# Patient Record
Sex: Female | Born: 1935 | Race: White | Hispanic: No
Health system: Southern US, Community
[De-identification: ages and names within clinical notes are randomized; demographics above are authoritative.]

---

## 2004-06-28 ENCOUNTER — Other Ambulatory Visit: Payer: Self-pay

## 2004-08-11 ENCOUNTER — Ambulatory Visit: Payer: Self-pay | Admitting: Internal Medicine

## 2004-08-25 ENCOUNTER — Ambulatory Visit: Payer: Self-pay | Admitting: Internal Medicine

## 2004-08-26 ENCOUNTER — Ambulatory Visit: Payer: Self-pay | Admitting: Oncology

## 2004-09-18 ENCOUNTER — Ambulatory Visit: Payer: Self-pay | Admitting: Oncology

## 2004-09-21 ENCOUNTER — Ambulatory Visit: Payer: Self-pay | Admitting: Oncology

## 2004-10-01 ENCOUNTER — Ambulatory Visit: Payer: Self-pay | Admitting: Oncology

## 2004-11-19 ENCOUNTER — Ambulatory Visit: Payer: Self-pay | Admitting: Oncology

## 2004-12-02 ENCOUNTER — Ambulatory Visit: Payer: Self-pay | Admitting: Oncology

## 2005-02-08 ENCOUNTER — Ambulatory Visit: Payer: Self-pay | Admitting: Oncology

## 2005-02-10 ENCOUNTER — Ambulatory Visit: Payer: Self-pay | Admitting: Oncology

## 2005-03-01 ENCOUNTER — Ambulatory Visit: Payer: Self-pay | Admitting: Oncology

## 2005-03-16 ENCOUNTER — Ambulatory Visit: Payer: Self-pay | Admitting: Gastroenterology

## 2005-03-26 ENCOUNTER — Other Ambulatory Visit: Payer: Self-pay

## 2005-04-02 ENCOUNTER — Inpatient Hospital Stay: Payer: Self-pay | Admitting: General Surgery

## 2005-04-02 ENCOUNTER — Ambulatory Visit: Payer: Self-pay | Admitting: Internal Medicine

## 2005-05-14 ENCOUNTER — Ambulatory Visit: Payer: Self-pay | Admitting: Oncology

## 2005-05-18 ENCOUNTER — Ambulatory Visit: Payer: Self-pay | Admitting: Oncology

## 2005-08-18 ENCOUNTER — Ambulatory Visit: Payer: Self-pay | Admitting: Oncology

## 2005-08-24 ENCOUNTER — Ambulatory Visit: Payer: Self-pay | Admitting: Oncology

## 2005-09-20 ENCOUNTER — Ambulatory Visit: Payer: Self-pay | Admitting: Oncology

## 2005-09-21 ENCOUNTER — Ambulatory Visit: Payer: Self-pay | Admitting: Oncology

## 2005-10-01 ENCOUNTER — Ambulatory Visit: Payer: Self-pay | Admitting: Oncology

## 2005-11-26 ENCOUNTER — Ambulatory Visit: Payer: Self-pay | Admitting: Oncology

## 2005-12-06 ENCOUNTER — Emergency Department: Payer: Self-pay | Admitting: Internal Medicine

## 2005-12-09 ENCOUNTER — Ambulatory Visit: Payer: Self-pay | Admitting: Oncology

## 2006-01-13 ENCOUNTER — Ambulatory Visit: Payer: Self-pay | Admitting: Oncology

## 2006-01-21 ENCOUNTER — Ambulatory Visit: Payer: Self-pay | Admitting: Otolaryngology

## 2006-01-26 ENCOUNTER — Ambulatory Visit: Payer: Self-pay | Admitting: Otolaryngology

## 2006-01-30 ENCOUNTER — Ambulatory Visit: Payer: Self-pay | Admitting: Oncology

## 2006-03-18 ENCOUNTER — Ambulatory Visit: Payer: Self-pay | Admitting: Oncology

## 2006-04-01 ENCOUNTER — Ambulatory Visit: Payer: Self-pay | Admitting: Oncology

## 2006-04-17 ENCOUNTER — Other Ambulatory Visit: Payer: Self-pay

## 2006-04-18 ENCOUNTER — Inpatient Hospital Stay: Payer: Self-pay | Admitting: Internal Medicine

## 2006-04-25 ENCOUNTER — Ambulatory Visit: Payer: Self-pay | Admitting: Internal Medicine

## 2006-05-02 ENCOUNTER — Ambulatory Visit: Payer: Self-pay | Admitting: Internal Medicine

## 2006-05-06 ENCOUNTER — Emergency Department: Payer: Self-pay | Admitting: Emergency Medicine

## 2006-05-06 ENCOUNTER — Other Ambulatory Visit: Payer: Self-pay

## 2006-05-16 ENCOUNTER — Ambulatory Visit: Payer: Self-pay | Admitting: Internal Medicine

## 2006-05-19 ENCOUNTER — Ambulatory Visit: Payer: Self-pay | Admitting: Internal Medicine

## 2006-05-26 ENCOUNTER — Ambulatory Visit: Payer: Self-pay | Admitting: Internal Medicine

## 2006-05-27 ENCOUNTER — Ambulatory Visit: Payer: Self-pay | Admitting: Oncology

## 2006-06-09 ENCOUNTER — Ambulatory Visit: Payer: Self-pay | Admitting: Internal Medicine

## 2006-06-13 ENCOUNTER — Ambulatory Visit: Payer: Self-pay | Admitting: Internal Medicine

## 2006-06-14 ENCOUNTER — Ambulatory Visit: Payer: Self-pay | Admitting: Internal Medicine

## 2006-06-22 ENCOUNTER — Ambulatory Visit: Payer: Self-pay | Admitting: Internal Medicine

## 2006-07-14 ENCOUNTER — Ambulatory Visit: Payer: Self-pay | Admitting: Internal Medicine

## 2006-08-25 ENCOUNTER — Ambulatory Visit: Payer: Self-pay | Admitting: Oncology

## 2006-09-01 ENCOUNTER — Ambulatory Visit: Payer: Self-pay | Admitting: Oncology

## 2006-12-14 ENCOUNTER — Ambulatory Visit: Payer: Self-pay | Admitting: Oncology

## 2006-12-16 ENCOUNTER — Ambulatory Visit: Payer: Self-pay | Admitting: Oncology

## 2007-03-02 ENCOUNTER — Ambulatory Visit: Payer: Self-pay | Admitting: Oncology

## 2007-03-29 ENCOUNTER — Ambulatory Visit: Payer: Self-pay | Admitting: Oncology

## 2007-04-02 ENCOUNTER — Ambulatory Visit: Payer: Self-pay | Admitting: Oncology

## 2007-05-17 ENCOUNTER — Ambulatory Visit: Payer: Self-pay | Admitting: Internal Medicine

## 2007-06-20 ENCOUNTER — Ambulatory Visit: Payer: Self-pay | Admitting: Oncology

## 2007-07-03 ENCOUNTER — Ambulatory Visit: Payer: Self-pay | Admitting: Oncology

## 2007-07-12 ENCOUNTER — Ambulatory Visit: Payer: Self-pay | Admitting: Oncology

## 2007-07-17 ENCOUNTER — Ambulatory Visit: Payer: Self-pay | Admitting: Oncology

## 2007-08-02 ENCOUNTER — Ambulatory Visit: Payer: Self-pay | Admitting: Oncology

## 2007-08-09 ENCOUNTER — Emergency Department: Payer: Self-pay | Admitting: Emergency Medicine

## 2007-08-09 ENCOUNTER — Other Ambulatory Visit: Payer: Self-pay

## 2007-08-10 ENCOUNTER — Other Ambulatory Visit: Payer: Self-pay

## 2007-08-10 ENCOUNTER — Inpatient Hospital Stay: Payer: Self-pay | Admitting: Internal Medicine

## 2007-12-31 ENCOUNTER — Ambulatory Visit: Payer: Self-pay | Admitting: Oncology

## 2008-01-16 ENCOUNTER — Ambulatory Visit: Payer: Self-pay | Admitting: Oncology

## 2008-01-31 ENCOUNTER — Ambulatory Visit: Payer: Self-pay | Admitting: Oncology

## 2008-02-07 ENCOUNTER — Emergency Department: Payer: Self-pay | Admitting: Emergency Medicine

## 2008-02-07 ENCOUNTER — Other Ambulatory Visit: Payer: Self-pay

## 2008-04-01 ENCOUNTER — Ambulatory Visit: Payer: Self-pay | Admitting: Internal Medicine

## 2008-04-01 ENCOUNTER — Ambulatory Visit: Payer: Self-pay | Admitting: Oncology

## 2008-04-23 ENCOUNTER — Ambulatory Visit: Payer: Self-pay | Admitting: Oncology

## 2008-05-01 ENCOUNTER — Ambulatory Visit: Payer: Self-pay | Admitting: Internal Medicine

## 2008-05-01 ENCOUNTER — Ambulatory Visit: Payer: Self-pay | Admitting: Oncology

## 2008-07-02 ENCOUNTER — Ambulatory Visit: Payer: Self-pay | Admitting: Oncology

## 2008-07-15 ENCOUNTER — Ambulatory Visit: Payer: Self-pay | Admitting: Oncology

## 2008-07-29 ENCOUNTER — Ambulatory Visit: Payer: Self-pay | Admitting: Internal Medicine

## 2008-08-01 ENCOUNTER — Ambulatory Visit: Payer: Self-pay | Admitting: Oncology

## 2008-08-27 ENCOUNTER — Ambulatory Visit: Payer: Self-pay | Admitting: Family Medicine

## 2008-09-03 ENCOUNTER — Ambulatory Visit: Payer: Self-pay | Admitting: Internal Medicine

## 2008-09-18 ENCOUNTER — Ambulatory Visit: Payer: Self-pay | Admitting: Internal Medicine

## 2008-10-07 ENCOUNTER — Ambulatory Visit: Payer: Self-pay | Admitting: Internal Medicine

## 2008-10-08 ENCOUNTER — Ambulatory Visit: Payer: Self-pay | Admitting: Oncology

## 2008-10-09 ENCOUNTER — Ambulatory Visit: Payer: Self-pay | Admitting: Internal Medicine

## 2008-10-15 ENCOUNTER — Emergency Department: Payer: Self-pay | Admitting: Unknown Physician Specialty

## 2008-10-16 ENCOUNTER — Emergency Department: Payer: Self-pay | Admitting: Emergency Medicine

## 2008-11-23 ENCOUNTER — Ambulatory Visit: Payer: Self-pay | Admitting: Internal Medicine

## 2008-12-11 ENCOUNTER — Ambulatory Visit: Payer: Self-pay | Admitting: Internal Medicine

## 2009-02-11 ENCOUNTER — Ambulatory Visit: Payer: Self-pay | Admitting: Internal Medicine

## 2009-09-03 ENCOUNTER — Ambulatory Visit: Payer: Self-pay | Admitting: Internal Medicine

## 2009-11-01 ENCOUNTER — Ambulatory Visit: Payer: Self-pay | Admitting: Oncology

## 2009-12-01 ENCOUNTER — Ambulatory Visit: Payer: Self-pay | Admitting: Oncology

## 2009-12-02 ENCOUNTER — Ambulatory Visit: Payer: Self-pay | Admitting: Oncology

## 2010-03-01 ENCOUNTER — Ambulatory Visit: Payer: Self-pay | Admitting: Oncology

## 2010-03-06 ENCOUNTER — Ambulatory Visit: Payer: Self-pay | Admitting: Oncology

## 2010-03-12 ENCOUNTER — Ambulatory Visit: Payer: Self-pay | Admitting: Oncology

## 2010-04-01 ENCOUNTER — Ambulatory Visit: Payer: Self-pay | Admitting: Oncology

## 2010-08-07 ENCOUNTER — Ambulatory Visit: Payer: Self-pay | Admitting: Specialist

## 2010-09-07 ENCOUNTER — Ambulatory Visit: Payer: Self-pay | Admitting: Internal Medicine

## 2010-12-12 ENCOUNTER — Emergency Department: Payer: Self-pay | Admitting: Emergency Medicine

## 2011-01-31 ENCOUNTER — Ambulatory Visit: Payer: Self-pay | Admitting: Oncology

## 2011-02-13 ENCOUNTER — Emergency Department: Payer: Self-pay | Admitting: Internal Medicine

## 2011-02-20 ENCOUNTER — Ambulatory Visit: Payer: Self-pay | Admitting: Internal Medicine

## 2011-02-21 ENCOUNTER — Emergency Department: Payer: Self-pay | Admitting: Emergency Medicine

## 2011-02-25 ENCOUNTER — Inpatient Hospital Stay: Payer: Self-pay | Admitting: Internal Medicine

## 2011-02-25 ENCOUNTER — Ambulatory Visit: Payer: Self-pay | Admitting: Oncology

## 2011-02-27 LAB — CANCER ANTIGEN 27.29: CA 27.29: 27.1 U/mL (ref 0.0–38.6)

## 2011-03-01 ENCOUNTER — Inpatient Hospital Stay: Payer: Self-pay | Admitting: Internal Medicine

## 2011-03-02 ENCOUNTER — Ambulatory Visit: Payer: Self-pay | Admitting: Oncology

## 2011-03-12 ENCOUNTER — Inpatient Hospital Stay: Payer: Self-pay | Admitting: Internal Medicine

## 2011-03-19 ENCOUNTER — Emergency Department: Payer: Self-pay | Admitting: Emergency Medicine

## 2011-03-31 ENCOUNTER — Emergency Department: Payer: Self-pay | Admitting: Emergency Medicine

## 2011-03-31 ENCOUNTER — Emergency Department: Payer: Self-pay | Admitting: Unknown Physician Specialty

## 2011-04-21 ENCOUNTER — Ambulatory Visit: Payer: Self-pay

## 2011-09-24 ENCOUNTER — Emergency Department: Payer: Self-pay | Admitting: Emergency Medicine

## 2011-10-01 ENCOUNTER — Inpatient Hospital Stay: Payer: Self-pay | Admitting: Family Medicine

## 2011-10-02 ENCOUNTER — Ambulatory Visit: Payer: Self-pay | Admitting: Internal Medicine

## 2011-11-02 ENCOUNTER — Ambulatory Visit: Payer: Self-pay | Admitting: Internal Medicine

## 2011-11-02 LAB — CBC WITH DIFFERENTIAL/PLATELET
Basophil %: 0.3 %
Eosinophil #: 0.3 10*3/uL (ref 0.0–0.7)
Eosinophil %: 3.7 %
HCT: 32.7 % — ABNORMAL LOW (ref 35.0–47.0)
HGB: 10.9 g/dL — ABNORMAL LOW (ref 12.0–16.0)
Lymphocyte %: 20.9 %
MCHC: 33.2 g/dL (ref 32.0–36.0)
Monocyte %: 7.8 %
Neutrophil #: 5.5 10*3/uL (ref 1.4–6.5)
RBC: 3.49 10*6/uL — ABNORMAL LOW (ref 3.80–5.20)
RDW: 13.1 % (ref 11.5–14.5)
WBC: 8.1 10*3/uL (ref 3.6–11.0)

## 2011-11-03 LAB — CBC WITH DIFFERENTIAL/PLATELET
Basophil %: 0.3 %
Eosinophil #: 0.3 10*3/uL (ref 0.0–0.7)
HCT: 33.2 % — ABNORMAL LOW (ref 35.0–47.0)
Lymphocyte #: 1.2 10*3/uL (ref 1.0–3.6)
MCH: 30.4 pg (ref 26.0–34.0)
MCHC: 31.8 g/dL — ABNORMAL LOW (ref 32.0–36.0)
MCV: 95 fL (ref 80–100)
Monocyte #: 0.6 10*3/uL (ref 0.0–0.7)
Platelet: 298 10*3/uL (ref 150–440)
RBC: 3.48 10*6/uL — ABNORMAL LOW (ref 3.80–5.20)
WBC: 7.5 10*3/uL (ref 3.6–11.0)

## 2011-11-03 LAB — BASIC METABOLIC PANEL
BUN: 17 mg/dL (ref 7–18)
Chloride: 99 mmol/L (ref 98–107)
Co2: 36 mmol/L — ABNORMAL HIGH (ref 21–32)
Creatinine: 0.55 mg/dL — ABNORMAL LOW (ref 0.60–1.30)
Potassium: 4.4 mmol/L (ref 3.5–5.1)
Sodium: 142 mmol/L (ref 136–145)

## 2011-11-05 LAB — BASIC METABOLIC PANEL
BUN: 20 mg/dL — ABNORMAL HIGH (ref 7–18)
Calcium, Total: 9.2 mg/dL (ref 8.5–10.1)
Chloride: 99 mmol/L (ref 98–107)
Creatinine: 0.63 mg/dL (ref 0.60–1.30)
EGFR (African American): >60
EGFR (Non-African Amer.): >60
Glucose: 105 mg/dL — ABNORMAL HIGH (ref 65–99)
Potassium: 4.6 mmol/L (ref 3.5–5.1)
Sodium: 140 mmol/L (ref 136–145)

## 2011-11-05 LAB — CBC WITH DIFFERENTIAL/PLATELET
Basophil #: 0 10*3/uL (ref 0.0–0.1)
Eosinophil %: 3.5 %
Lymphocyte #: 1.2 10*3/uL (ref 1.0–3.6)
Lymphocyte %: 17.3 %
MCV: 96 fL (ref 80–100)
Monocyte %: 7.4 %
Neutrophil %: 71.4 %
Platelet: 237 10*3/uL (ref 150–440)
RBC: 3.43 10*6/uL — ABNORMAL LOW (ref 3.80–5.20)
RDW: 14.2 % (ref 11.5–14.5)
WBC: 7.1 10*3/uL (ref 3.6–11.0)

## 2011-12-30 IMAGING — CR DG CHEST 1V PORT
1 series · 2 of 2 positions shown · non-contrast
Comparison: none

REASON FOR EXAM: wheezing dyspnea
COMMENTS:

[Series 1: view not recorded · 0.17mm/px · 2 of 2 slices shown]
[im 1/2]
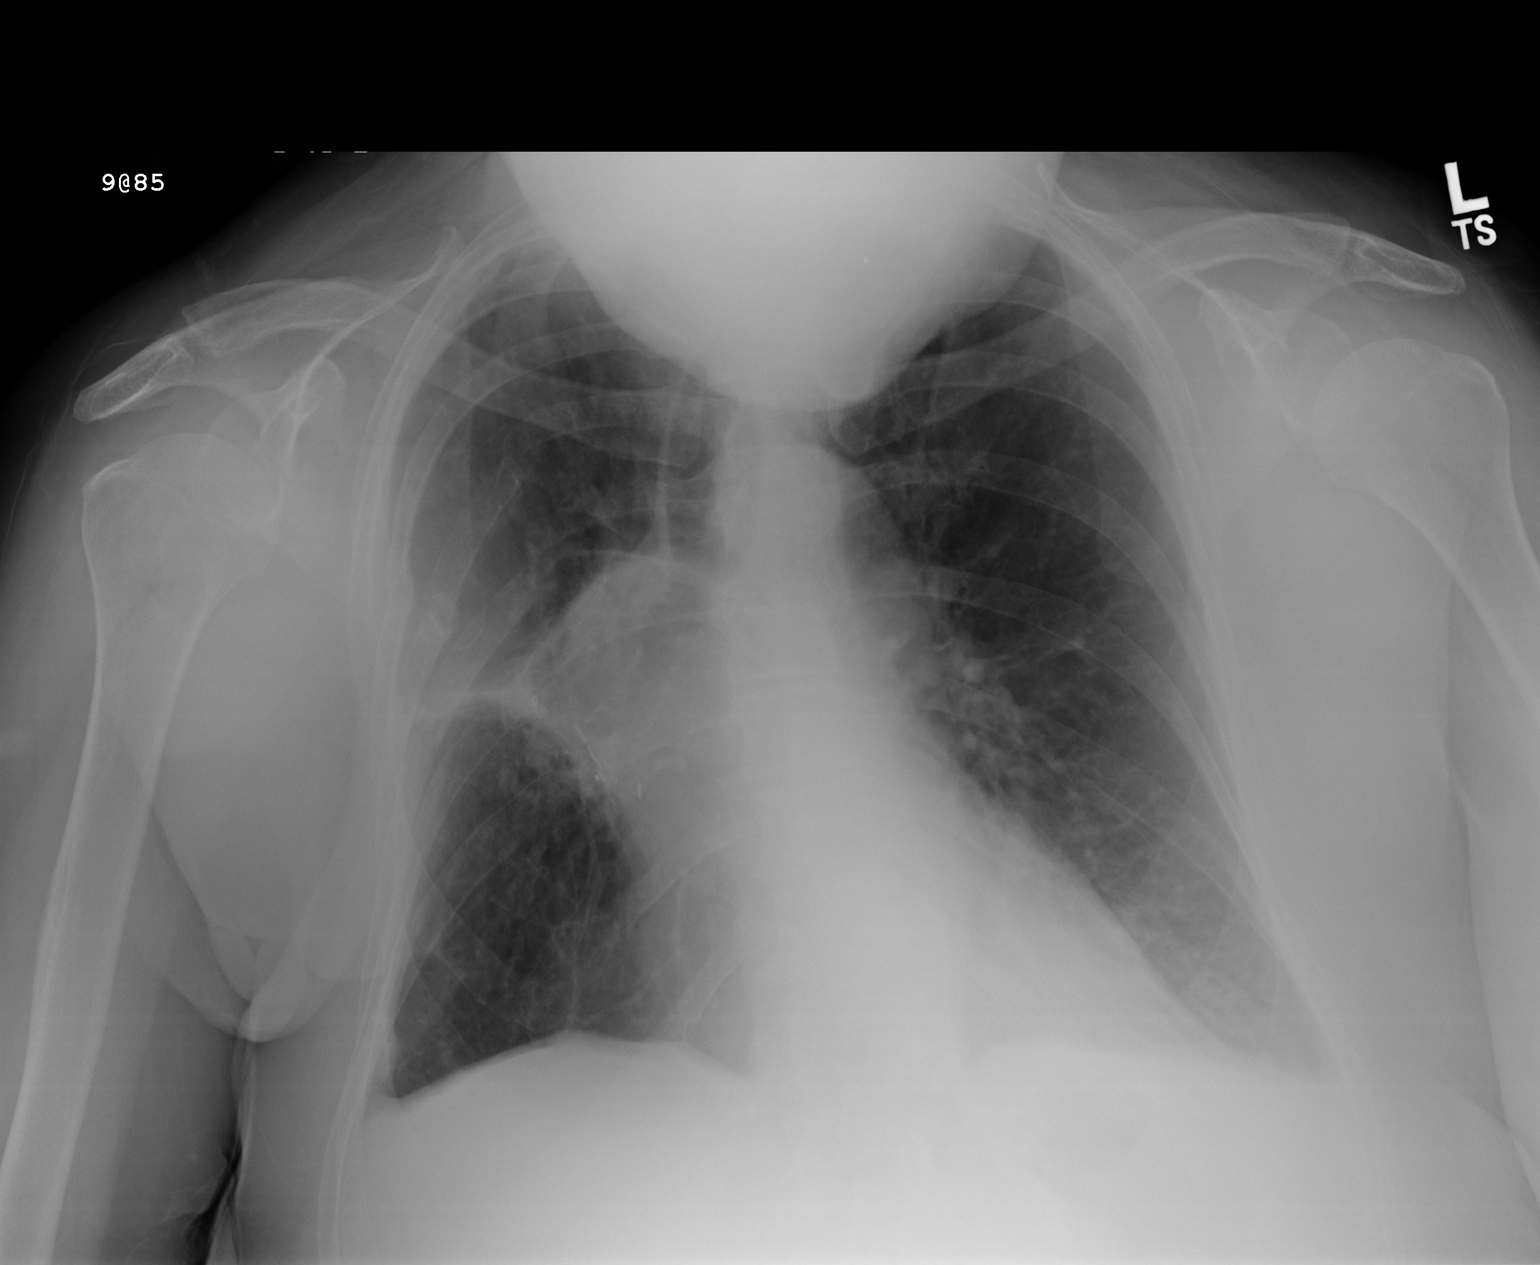
[im 2/2]
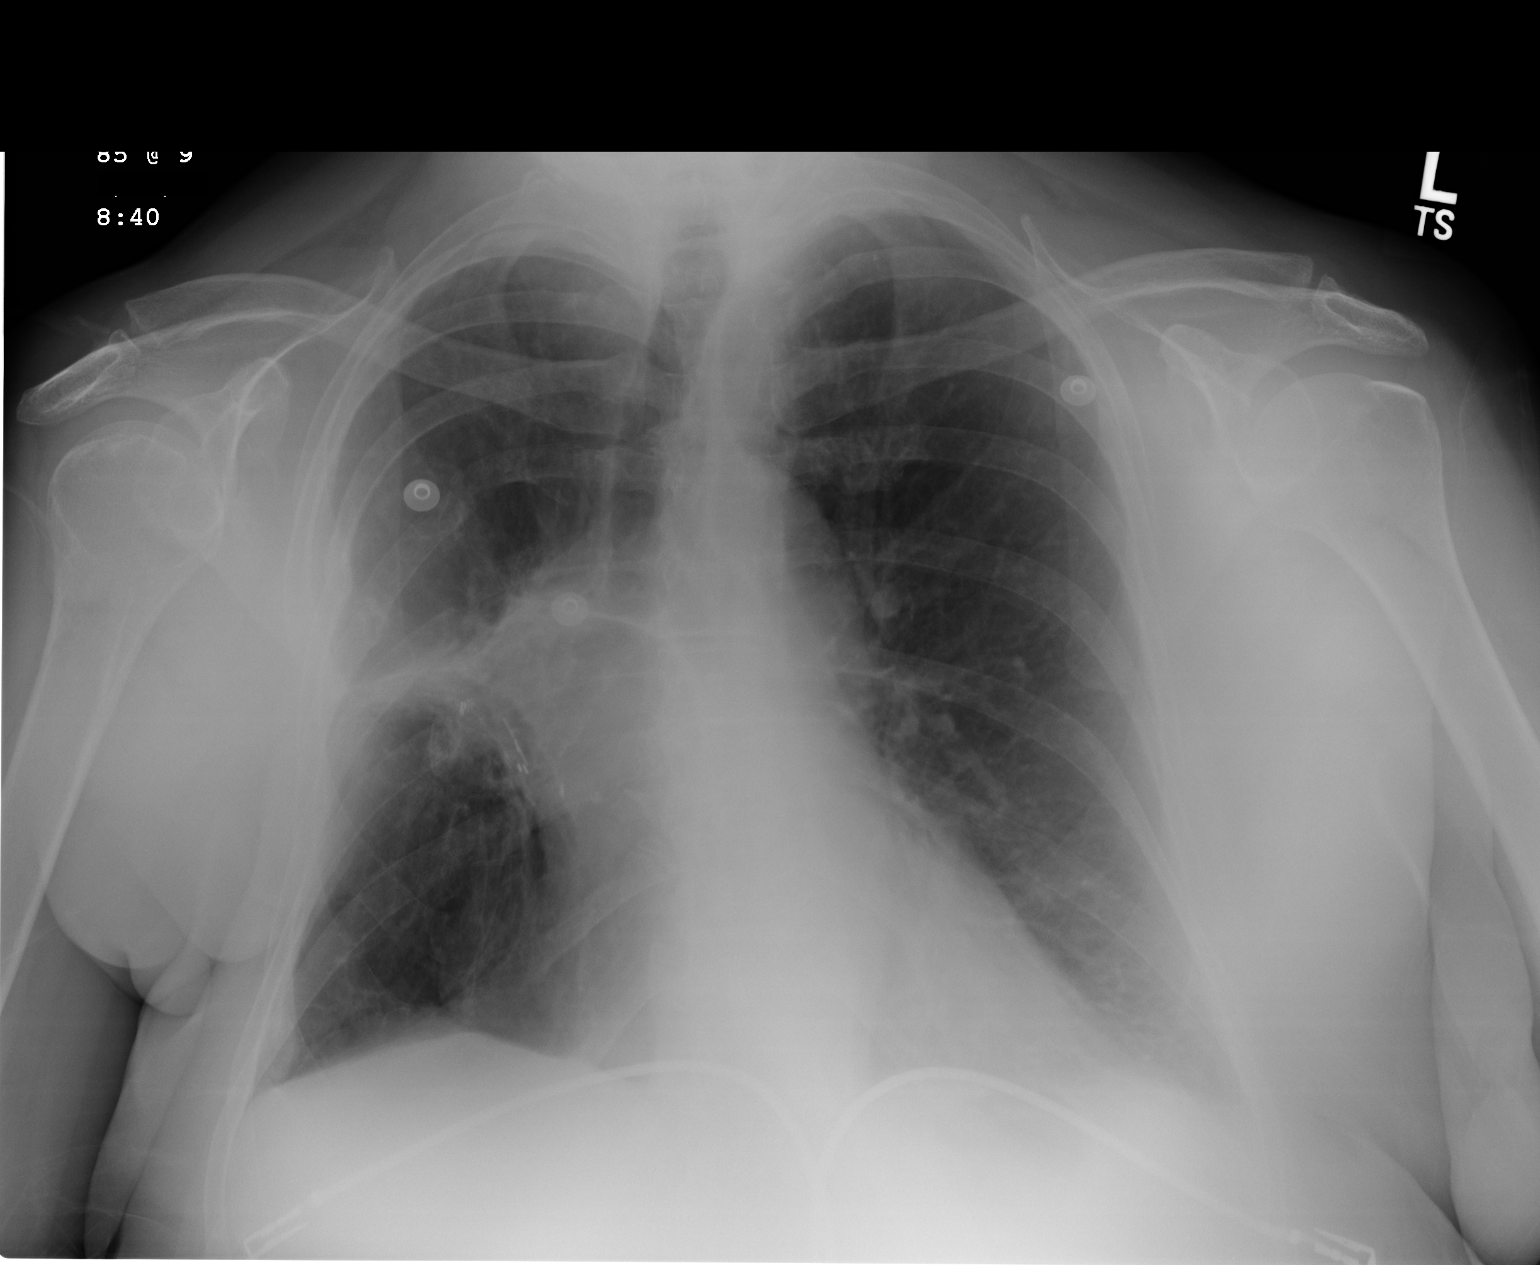

[2 of 2 positions shown; findings below may reference images not displayed]

PROCEDURE:     DXR - DXR PORTABLE CHEST SINGLE VIEW  - March 12, 2011  [DATE]

RESULT:     Comparison is made to the prior exam of 03/12/2011. There is
increased density about the right hilum and extending laterally from the
right hilum. This has been present previously and is consistent with
postoperative and postradiation change. No progressive density about the
right hilum is seen to indicate recurrent bulk tumor. No pulmonary edema is
seen. No pleural effusion is observed. No pneumonia is identified. The chest
is hyperexpanded consistent with a history of COPD.
IMPRESSION: 1. Postoperative and postradiation changes are noted in the right midlung
field. This appearance is stable as compared to multiple prior examinations.
2. No new pulmonary infiltrates are seen.
3. COPD.

## 2011-12-31 DEATH — deceased

## 2012-01-06 IMAGING — CR DG CHEST 1V PORT
1 series · 1 of 1 positions shown · non-contrast
Comparison: none

REASON FOR EXAM: Shortness of Breath
COMMENTS:

[view not recorded]
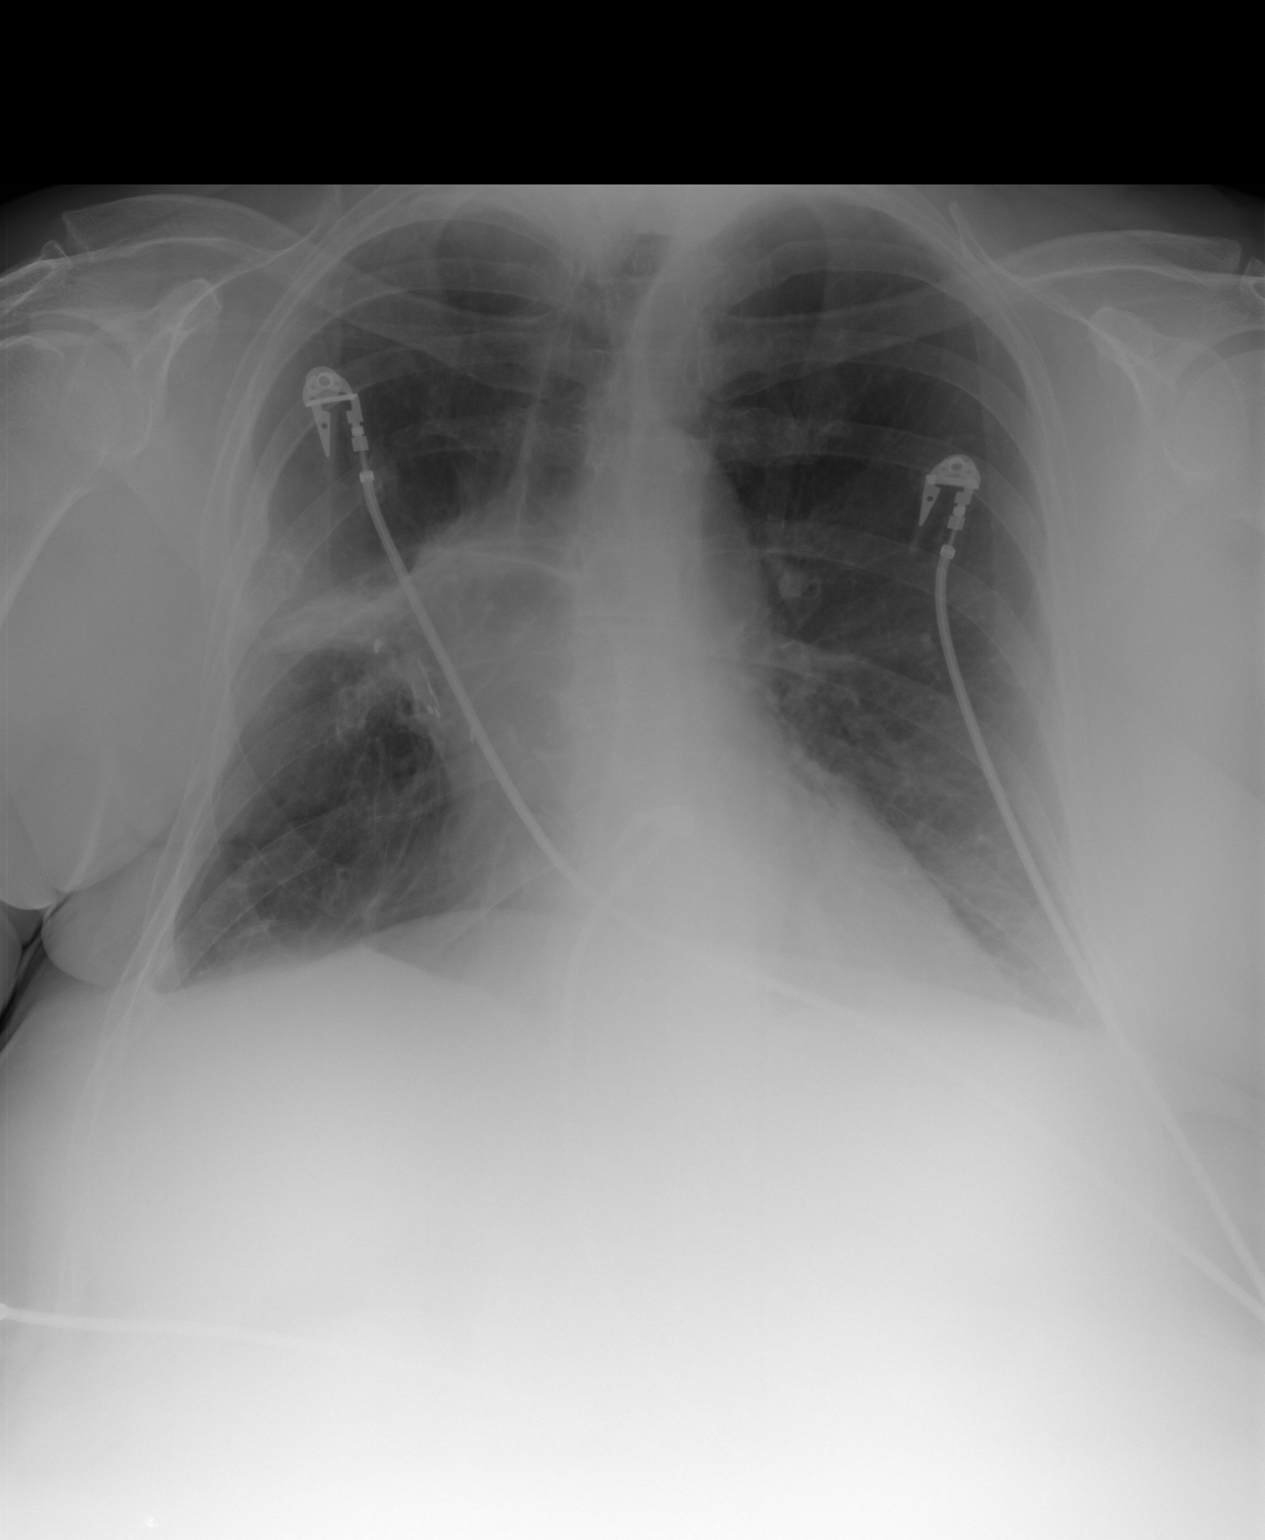

[1 of 1 positions shown; findings below may reference images not displayed]

PROCEDURE:     DXR - DXR PORTABLE CHEST SINGLE VIEW  - March 19, 2011  [DATE]

RESULT:     Comparison is made to the study of 03/12/2011. Persistent density
is seen radiating from the right hilar region to the lateral right chest
wall. The left lung is clear. The heart is normal in size. There is minimal
right lung base atelectasis. Cardiac monitoring electrodes are present.
IMPRESSION: Persistent density in the right hemithorax which may be
secondary to underlying malignancy.

## 2012-08-08 IMAGING — CR DG CHEST 1V PORT
1 series · 1 of 1 positions shown · non-contrast
Comparison: none

REASON FOR EXAM: Vent
COMMENTS:

PROCEDURE:     DXR - DXR PORTABLE CHEST SINGLE VIEW  - October 20, 2011  [DATE]
RESULT:     Comparison: 10/15/2011

[portable]
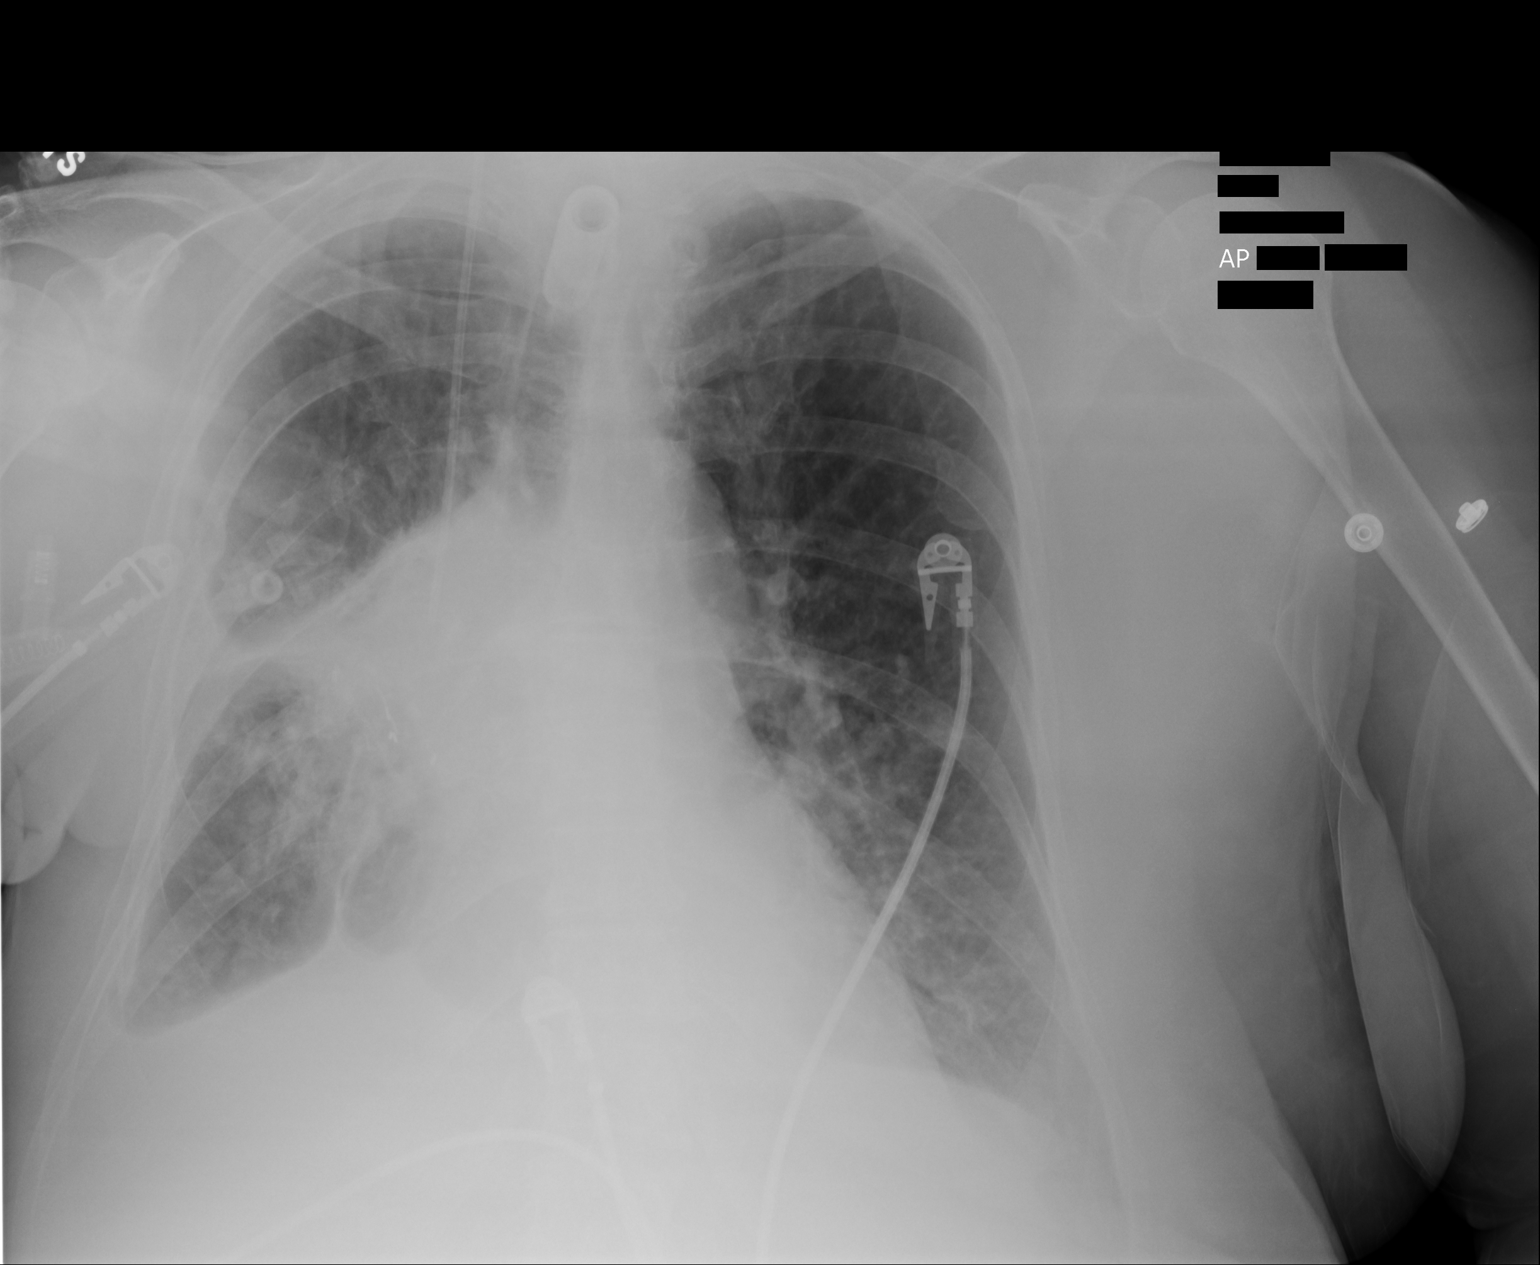

[1 of 1 positions shown; findings below may reference images not displayed]

FINDINGS: The support apparatus is stable. Heart and mediastinum are unchanged.
Triangular opacity extending from the right hilum is unchanged. There are
bilateral heterogeneous, predominantly interstitial, pulmonary opacities.
These are greatest in the right lung. These are slightly increased from
prior.
IMPRESSION: Slight increase in asymmetric heterogeneous interstitial pulmonary
opacities. These may represent interstitial pulmonary edema or infection.

## 2012-08-09 IMAGING — XA IR VASCULAR PROCEDURE
1 series · 1 of 1 positions shown · non-contrast
Comparison: none

[Series 2: single · 1 of 1 slices shown]
[im 1/1]
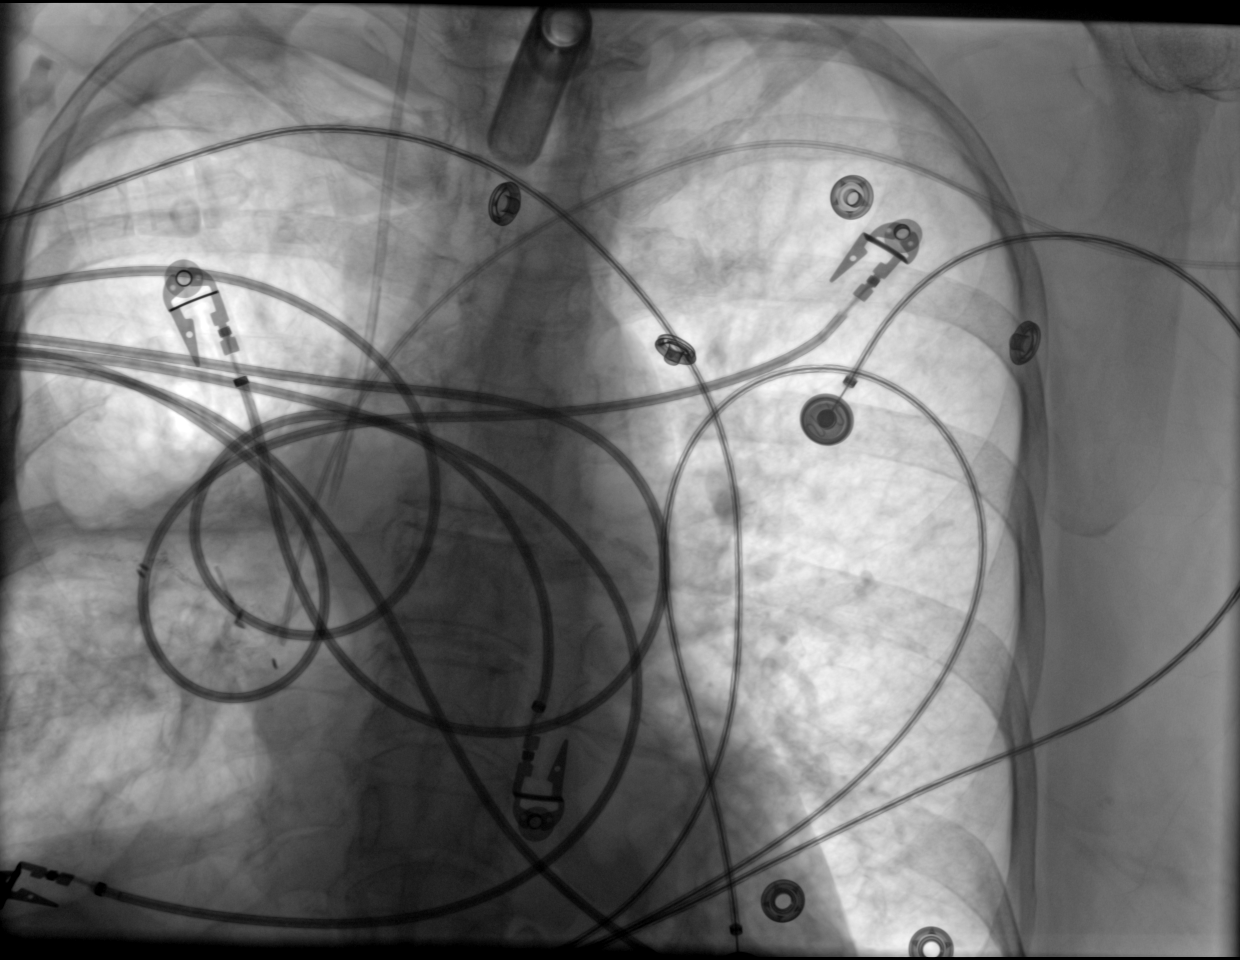

[1 of 1 positions shown; findings below may reference images not displayed]

IMAGES IMPORTED FROM THE [employer] WORKFLOW SYSTEM
NO DICTATION FOR STUDY

## 2012-08-12 IMAGING — CR DG CHEST 1V PORT
1 series · 1 of 1 positions shown · non-contrast
Comparison: none

REASON FOR EXAM: on Vent
COMMENTS:

[ap]
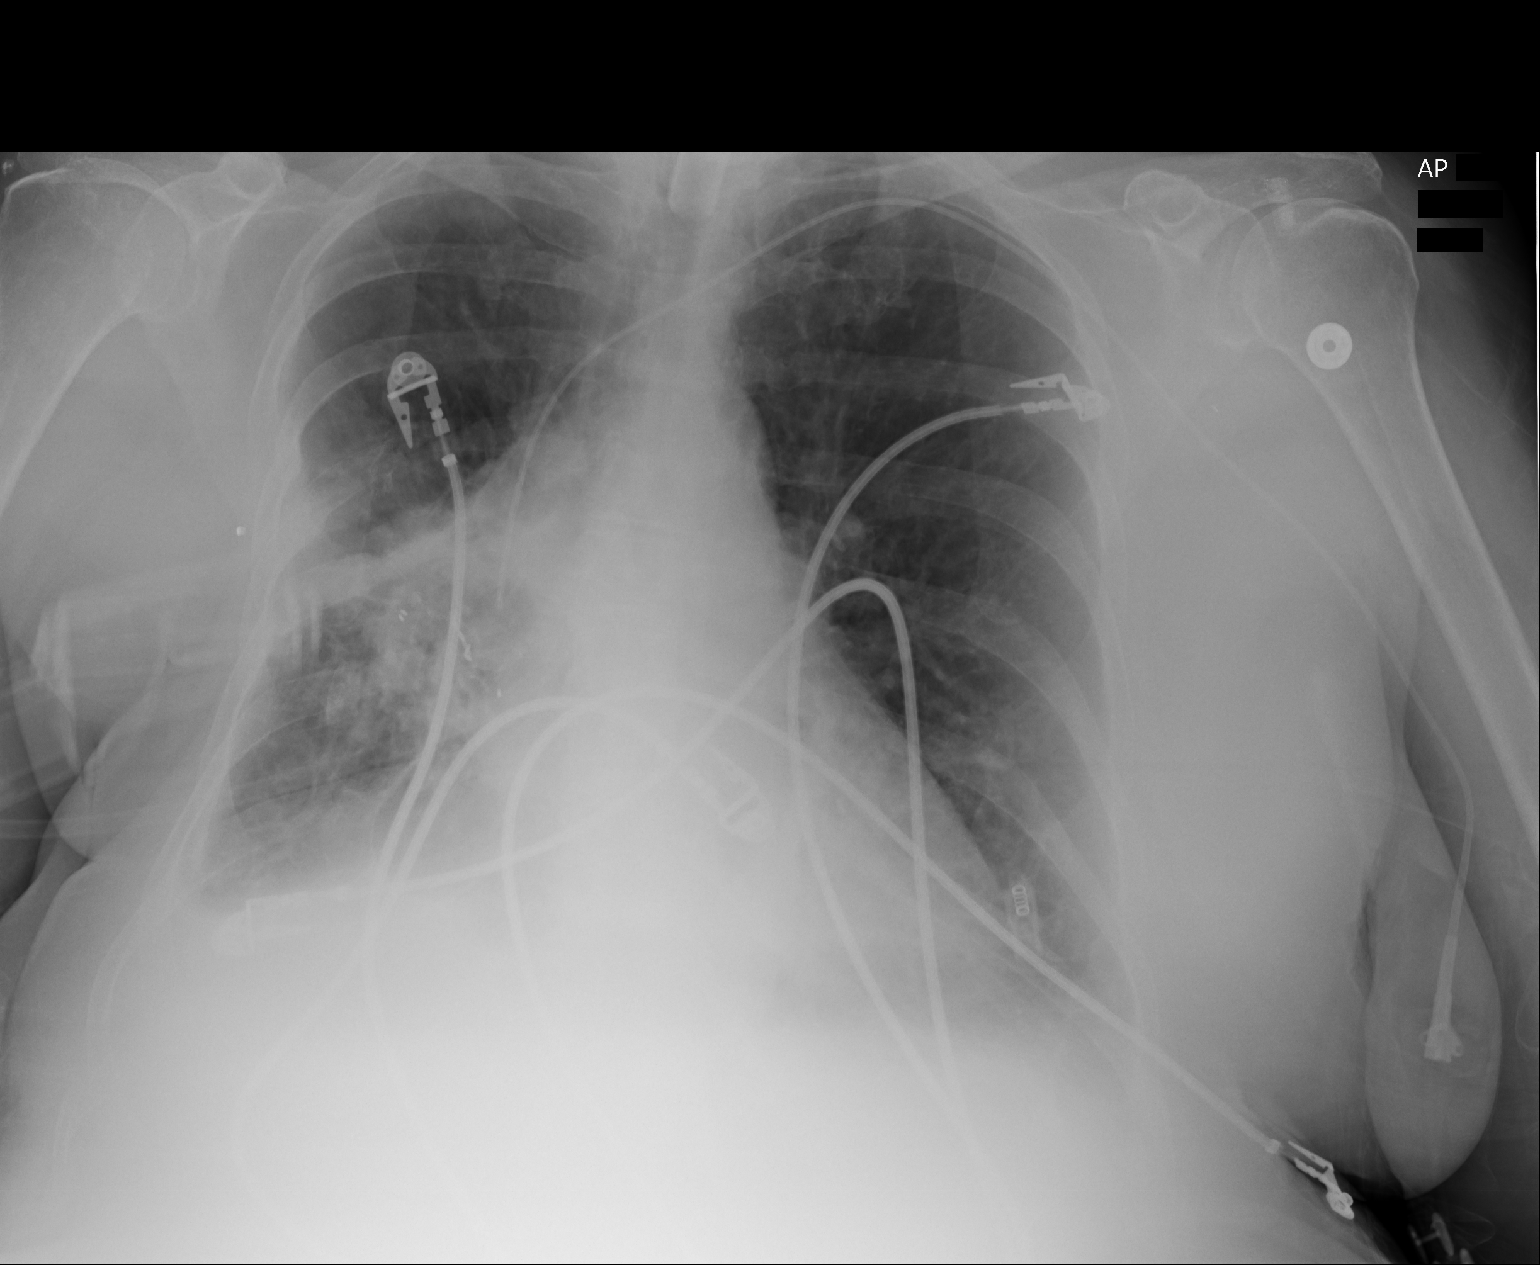

[1 of 1 positions shown; findings below may reference images not displayed]

PROCEDURE:     DXR - DXR PORTABLE CHEST SINGLE VIEW  - October 24, 2011  [DATE]

RESULT:     Comparison is made to study 22 October, 2011 at [DATE] a.m.

The left lung is well-expanded. The left hemidiaphragm is slightly less well
demonstrated today. On the right increased density in the midlung and
perihilar region is stable. The hemidiaphragm is partially obscured. The
tracheostomy appliance tip lies at the level of the clavicular heads. The
left subclavian venous catheter tip lies in the region of the midportion of
the SVC. The cardiac silhouette is top normal in size.
IMPRESSION: There may been slight interval improvement in the
appearance of the lungs since the prior study which may reflect improvement
in interstitial edema.

## 2015-02-23 NOTE — Discharge Summary (Signed)
PATIENT NAME:  Krystal Neal, Krystal Neal MR#:  [id number] DATE OF BIRTH:  1935-10-29  DATE OF ADMISSION:  10/01/2011 DATE OF DISCHARGE:  10/28/2011  HOSPITAL COURSE: Krystal Neal has been fairly stable on ventilatory support. She was attempted to be weaned from the ventilatory and made it about eight hours before she had a significant respiratory failure and once again required to be on the vent. Have been slowly attempting over the last few days to slowly wean her periodically from total ventilatory support and she has tolerated intermittently at different time intervals. Sputum did grow out some Pseudomonas. Her antibiotics were switched to [place]. She particularly was more infected around the trach collar area. This has responded well to that. She is coming up on an additional 10 days of that. Vital signs have remained stable. Her sugars have remained stable. Medication adjustments have been done. She has basically reached a steady state in her pulmonary dependency on her ventilatory support.   Last laboratory studies on the 23rd of December, her sugar was 112, BUN 13, creatinine 0.05, sodium 138, potassium 4.2, chloride 108. She did have some ammonia level variations as her Krystal Neal medications were adjusted by the psychiatrist, eventually felt that she was not a candidate to continue Krystal Neal and this was changed. This intervention by the psychiatrist had a positive effect on improving her mental status, her cooperativeness and her alertness.   Her last white count was 7800, hemoglobin stable at 9.5 on the 23rd of December, platelets 620,000. Her urinalysis remained negative though she has a Foley catheter draining.   DIAGNOSES:  1. Acute on chronic respiratory failure secondary to end-stage pulmonary fibrosis with previous cancer of the lung with radiation and oncology treatment to the right lung leaving her with further deterioration in her pulmonary status.  2. Schizophrenia. This may be more of a  schizo-paranoia variation in her diagnoses.  3. She has a lot of anxiety and depression as well.   PHYSICAL EXAMINATION: VITAL SIGNS: Vital signs remained fairly stable, temperature has been 98.5, pulses have been in the 90s, blood pressure 101/82 to 96/51, oximetries remained at 94%, fingerstick sugars have been in the 98 to 138 range.    CURRENT MEDICATIONS: At this dictation include:  1. Tylenol 650 mg for temperature greater than 101.4.  2. Norco 325/5, 1 to 2 tablets q.4 hours p.r.n. for moderate pain; she has required minimum.  3. Aspirin 81 mg daily.  4. Celexa 40 mg daily for depression. 5. Synthroid 0.025 mg daily for hypothyroid.  6. Tramadol 50 mg p.r.n. for mild pain.  7. Insulin sliding scale, 4 units for 201 to 250, 6 units for 251 to 300, 8 units for 301 to 350, and 10 units for 351 to 400.  8. Flovent 220, 2 puffs every 12 hours.  9. Colace liquid 50 mg, 100 mg twice a day. 10. Versed 1 mg as needed for agitation IV. 11. Combivent 8 puffs every four hours.  12. Potassium 10 mEq as a powder mixed in liquid b.Neal.d.  13. Protonix 40 mg daily.  14. Iron sulfate 220 mg in a 5 mL elixir, she gets 220 mg q.12 hours.  15. Fortaz 2 grams IV piggyback every eight hours.  16. Furosemide 20 mg twice a day.  17. Haldol 0.5 mg IV push every 4 to 6 hours p.r.n. for psychosis and agitation. 18. Lactulose 10 grams 30 mL b.Neal.d. This is being given for some elevation of her ammonia thought to have been related to  her Krystal Neal medication.  19. Klonopin 0.25 mg b.Neal.d.  20. Risperdal 1 mg at bedtime.  21. Glycopyrrolate injection 0.4 mg every eight hours for increasing pulmonary secretions.  22. Zovirax ointment to the lips for her fever blisters 5 times a day. 23. Mycostatin powder every 12 hours to the perineum area. 24. Scopolamine patch every three days. 25. Metaproterenol tartrate 50 mg every 12 hours.   PROGNOSIS: Overall prognosis remains guarded.   CODE STATUS: She is a FULL CODE.    ____________________________ Jimmie Molly. Candelaria Stagers, MD dcc:cms D: 10/28/2011 07:49:10 ET T: 10/28/2011 08:31:02 ET  JOB#: [id number] cc: Camera Krienke C. Candelaria Stagers, MD, <Dictator>   Virl Axe MD ELECTRONICALLY SIGNED 11/05/2011 16:06

## 2015-02-23 NOTE — Discharge Summary (Signed)
PATIENT NAME:  Krystal Neal, Krystal Neal MR#:  [id number] DATE OF BIRTH:  12-Oct-1935  DATE OF ADMISSION:  10/01/2011 DATE OF DISCHARGE:  11/05/2011  This is continuation of update on her ongoing discharge summaries.  HOSPITAL COURSE: Ms. Bick was admitted with acute on chronic respiratory failure ultimately requiring ventilatory support, ultimately having a trach and a feeding tube. Over the last week, she has been off the vent on trach collar support only and has tolerated this adequately. Her blood pressure has remained relatively low at systolics in the 045 range but she has been cardiovascularly stable with good cardiac output.   The psychiatrist has adjusted her medications for her anxiety/depressive syndrome and her paranoid tendencies. Whether this is a variation of schizophrenic or some other psycosis not confirmed. This is longstanding and ongoing multiple decade treatment program for this lady. On her laboratory studies done today, her sugar was 105, BUN 20, creatinine 0.63, sodium 142, potassium 4.6, chloride 34, eGFR greater than 60, white count 7100, hemoglobin stable at 10.6 with a previous history of chronic anemia.   CURRENT MEDICATIONS:  1. Tylenol 325 mg p.r.n. for pain or mild temperature elevation.  2. Norco 5/325 1 to 2 every four hours p.r.n. for pain. She has required minimum.  3. Celexa 40 mg daily for depression.  4. Synthroid 0.025 mg daily for hypothyroid. 5. Ultram 50 mg every six hours p.r.n. for moderate pain.  6. She has a PEG line that requires care and flushes.  7. Colace 50 mg liquid twice a day equivalent to 100 mg twice a day.  8. Protonix 40 mg suspension daily.  9. Ferrous 220 mg per 5 mL elixir. She gets that through the PEG twice a day. 10. Nystatin powder to the perineum area to reduce fungal infection.  11. Scopolamine patches transderm one every three days for secretion control.  12. Potassium 20 mEq. She takes 10 mEq twice a day with meals.  13. Aspirin 81  mg daily.  14. Klonopin 0.75 mg b.Neal.d. for her anxiety. 15. Klonopin 0.75 mg at bedtime.  16. Venlafaxine 75 mg b.Neal.d.   17. Insulin sliding scale. Sugars are checked before breakfast and before supper. She has 2 units for 151 to 200 blood sugars; no insulin for 0 to 150; 4 units for 201 to 250; 6 units for 251 to 300; 8 units for 301 to 350; 10 units 351 to 400.   18. Lopressor 12.5 mg every six hours for tachycardia and blood pressure. It is to be held if the systolic blood pressure is less than 100 or the heart rate is less than 70.  19. Seroquel 50 mg at bedtime.  20. Seroquel 25 mg b.Neal.d.  21. Seroquel 25 every eight hours p.r.n. for extreme anxiety and psychosis.  22. Albuterol inhaler with Atrovent 3 mL mixture every four hours. 23. Budesonide 0.5 mg at 2 mL. She gets it every 12 hours for SVN treatments.   The patient did have a chest x-ray today, portable. Except for the chronic fibrosing finding in the right lung field that is longstanding related to her previous lung cancer radiation scar tissue, she had no evidence of heart failure and no significant evidence of infection.   PROGNOSIS: Overall prognosis remains extremely guarded.  ____________________________ Dianah Field Mable Fill, MD dcc:drc D: 11/05/2011 15:56:06 ET T: 11/05/2011 16:14:50 ET JOB#: [id number]  cc: Carthel Castille C. Mable Fill, MD, <Dictator> Tawni Millers MD ELECTRONICALLY SIGNED 11/05/2011 16:27

## 2015-02-23 NOTE — Discharge Summary (Signed)
PATIENT NAME:  Krystal Neal, Krystal Neal MR#:  [id number] DATE OF BIRTH:  04/22/1935  DATE OF ADMISSION:  10/01/2011 DATE OF DISCHARGE:  11/06/2011  ADDENDUM  Patient was evaluated today and no changes to care plan. [facility], actually [facility] for post trach care.   ____________________________ Marisue IvanKanhka Beth Goodlin, MD kl:cms D: 11/06/2011 07:08:59 ET T: 11/06/2011 07:42:30 ET JOB#: [id number]  cc: Marisue IvanKanhka Lansing Sigmon, MD, <Dictator> Marisue IvanKANHKA Belenda Alviar MD ELECTRONICALLY SIGNED 11/16/2011 8:32
